# Patient Record
Sex: Male | Born: 1980 | Hispanic: No | Marital: Married | Smoking: Never smoker
Health system: Southern US, Community
[De-identification: ages and names within clinical notes are randomized; demographics above are authoritative.]

---

## 2018-01-02 ENCOUNTER — Emergency Department
Admission: EM | Admit: 2018-01-02 | Discharge: 2018-01-02 | Disposition: A | Discharge status: Home / Self Care | Payer: BLUE CROSS/BLUE SHIELD | Attending: Emergency Medicine | Admitting: Emergency Medicine

## 2018-01-02 ENCOUNTER — Other Ambulatory Visit: Payer: Self-pay

## 2018-01-02 ENCOUNTER — Encounter: Payer: Self-pay | Admitting: Emergency Medicine

## 2018-01-02 ENCOUNTER — Emergency Department: Payer: BLUE CROSS/BLUE SHIELD

## 2018-01-02 DIAGNOSIS — Y9366 Activity, soccer: Secondary | ICD-10-CM | POA: Insufficient documentation

## 2018-01-02 DIAGNOSIS — Y998 Other external cause status: Secondary | ICD-10-CM | POA: Diagnosis not present

## 2018-01-02 DIAGNOSIS — S060X0A Concussion without loss of consciousness, initial encounter: Secondary | ICD-10-CM | POA: Insufficient documentation

## 2018-01-02 DIAGNOSIS — R51 Headache: Secondary | ICD-10-CM | POA: Diagnosis not present

## 2018-01-02 DIAGNOSIS — Y92322 Soccer field as the place of occurrence of the external cause: Secondary | ICD-10-CM | POA: Diagnosis not present

## 2018-01-02 DIAGNOSIS — S0003XA Contusion of scalp, initial encounter: Secondary | ICD-10-CM | POA: Insufficient documentation

## 2018-01-02 DIAGNOSIS — R110 Nausea: Secondary | ICD-10-CM | POA: Insufficient documentation

## 2018-01-02 DIAGNOSIS — W01198A Fall on same level from slipping, tripping and stumbling with subsequent striking against other object, initial encounter: Secondary | ICD-10-CM | POA: Diagnosis not present

## 2018-01-02 DIAGNOSIS — R42 Dizziness and giddiness: Secondary | ICD-10-CM | POA: Insufficient documentation

## 2018-01-02 DIAGNOSIS — S0990XA Unspecified injury of head, initial encounter: Secondary | ICD-10-CM | POA: Diagnosis present

## 2018-01-02 NOTE — ED Provider Notes (Signed)
[facility] Emergency Department Provider Note   ____________________________________________   First MD Initiated Contact with Patient 01/02/18 1238     (approximate)  I have reviewed the triage vital signs and the nursing notes.   HISTORY  Chief Complaint No chief complaint on file.    HPI Shane Armstrong is a 37 y.o. male patient complaining of throbbing occipital headache, increased nausea and vertigo.  Denies vision disturbance.  Patient state he was playing soccer yesterday when he was thrown into a [place] and fell striking the back of his head.  Patient state he does not remember being taken outside after the incident.  Patient stated waking this morning with increased nausea but denies vomiting.  Patient contacted PCP was told to come to the emergency room for further evaluation.  Patient states the left occipital area of his head is tender to palpation but cannot feel a "knot".  Patient rates his pain as a 6/10.  Patient describes his pain is "throbbing".  No palliative measure for complaint.  History reviewed. No pertinent past medical history.  There are no active problems to display for this patient.   History reviewed. No pertinent surgical history.  Prior to Admission medications   Not on File    Allergies Patient has no known allergies.  No family history on file.  Social History Social History   Tobacco Use  . Smoking status: Never Smoker  . Smokeless tobacco: Never Used  Substance Use Topics  . Alcohol use: Not Currently  . Drug use: Never    Review of Systems Constitutional: No fever/chills Eyes: No visual changes. ENT: No sore throat. Cardiovascular: Denies chest pain. Respiratory: Denies shortness of breath. Gastrointestinal: No abdominal pain.  Nausea without vomiting.  No diarrhea.  No constipation. Genitourinary: Negative for dysuria. Musculoskeletal: Negative for back pain. Skin: Negative for  rash. Neurological: Positive for occipital headache headaches, but denies focal weakness or numbness.  States mild transient vertigo for abrupt movements.   ____________________________________________   PHYSICAL EXAM:  VITAL SIGNS: ED Triage Vitals  Enc Vitals Group     BP 01/02/18 1233 115/70     Pulse Rate 01/02/18 1233 64     Resp 01/02/18 1233 16     Temp 01/02/18 1233 97.9 F (36.6 C)     Temp Source 01/02/18 1233 Oral     SpO2 01/02/18 1233 100 %     Weight 01/02/18 1234 170 lb (77.1 kg)     Height 01/02/18 1234 5\' 11"  (1.803 m)     Head Circumference --      Peak Flow --      Pain Score 01/02/18 1233 6     Pain Loc --      Pain Edu? --      Excl. in GC? --    Constitutional: Alert and oriented. Well appearing and in no acute distress. Eyes: Conjunctivae are normal. PERRL. EOMI. Head: Atraumatic.  Moderate guarding palpation left occipital area. Nose: No congestion/rhinnorhea. Mouth/Throat: Mucous membranes are moist.  Oropharynx non-erythematous. Neck: No stridor.  No cervical spine tenderness to palpation. Cardiovascular: Normal rate, regular rhythm. Grossly normal heart sounds.  Good peripheral circulation. Respiratory: Normal respiratory effort.  No retractions. Lungs CTAB. Gastrointestinal: Soft and nontender. No distention. No abdominal bruits. No CVA tenderness. Neurologic:  Normal speech and language. No gross focal neurologic deficits are appreciated. No gait instability. Skin:  Skin is warm, dry and intact. No rash noted. Psychiatric: Mood and affect are normal.  Speech and behavior are normal.  ____________________________________________   LABS (all labs ordered are listed, but only abnormal results are displayed)  Labs Reviewed - No data to display ____________________________________________  EKG   ____________________________________________  RADIOLOGY  ED MD interpretation:    Official radiology report(s): Ct Head Wo Contrast  Result  Date: 01/02/2018 CLINICAL DATA:  Occipital head injury.  Injured playing soccer. EXAM: CT HEAD WITHOUT CONTRAST TECHNIQUE: Contiguous axial images were obtained from the base of the skull through the vertex without intravenous contrast. COMPARISON:  None FINDINGS: Brain: No evidence of acute infarction, hemorrhage, hydrocephalus, extra-axial collection or mass lesion/mass effect. Vascular: No hyperdense vessel or unexpected calcification. Skull: No osseous abnormality. Sinuses/Orbits: Visualized paranasal sinuses are clear. Visualized mastoid sinuses are clear. Visualized orbits demonstrate no focal abnormality. Other: None IMPRESSION: No acute intracranial pathology. Electronically Signed   By: Elige KoHetal  Patel   On: 01/02/2018 13:13    ____________________________________________   PROCEDURES  Procedure(s) performed: None  Procedures  Critical Care performed: No  ____________________________________________   INITIAL IMPRESSION / ASSESSMENT AND PLAN / ED COURSE  As part of my medical decision making, I reviewed the following data within the electronic MEDICAL RECORD NUMBER    Headache secondary to concussion.  Discussed CT findings with patient.  Discussed sequela of concussion with patient.  Patient given discharge care instruction.  Patient advised over-the-counter extra Tylenol as needed for headache and pain.  Patient advised [facility] no improvement in 1 week.  Return to ED if his condition worsens.      ____________________________________________   FINAL CLINICAL IMPRESSION(S) / ED DIAGNOSES  Final diagnoses:  Concussion without loss of consciousness, initial encounter  Contusion of scalp, initial encounter     ED Discharge Orders    None       Note:  This document was prepared using Dragon voice recognition software and may include unintentional dictation errors.    Joni ReiningSmith, Ronald K, PA-C 01/02/18 1342    Minna AntisPaduchowski, Kevin,  MD 01/02/18 [phone]

## 2018-01-02 NOTE — ED Triage Notes (Signed)
PT to ED via POV with c/o possible concussion after head injury playing soccer yesterday. Pt states head is sensitive, increased nausea. Per pcp pt was told to be evaluated in ED. VSS, pt ambulatory. Speech clear.

## 2018-01-02 NOTE — Discharge Instructions (Signed)
Follow discharge care instructions.  Advised only extra strength Tylenol for headache/pain.

## 2019-02-20 IMAGING — CT CT HEAD W/O CM
3 series · 16 of 47 positions shown, 19 images · non-contrast
Comparison: None

CLINICAL DATA: Occipital head injury.  Injured playing soccer.

EXAM:
CT HEAD WITHOUT CONTRAST
TECHNIQUE: Contiguous axial images were obtained from the base of the skull
through the vertex without intravenous contrast.

[Series 2: head wo · axial · 0.42mm/px · z∈[-119,+6]mm · 10 of 30 slices shown, 13 images]
[im 3/30  brain]
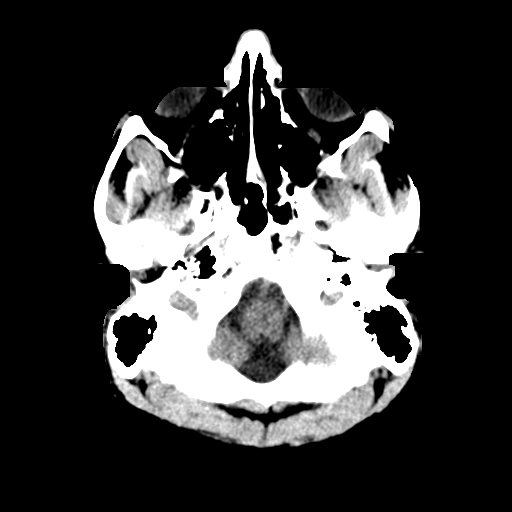
[im 3/30  bone]
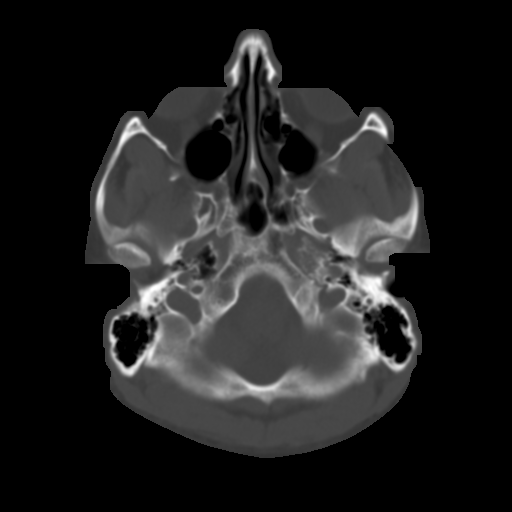
[im 6/30  brain]
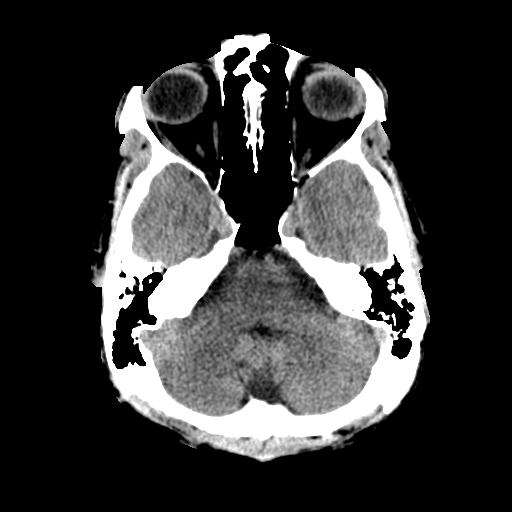
[im 9/30  brain]
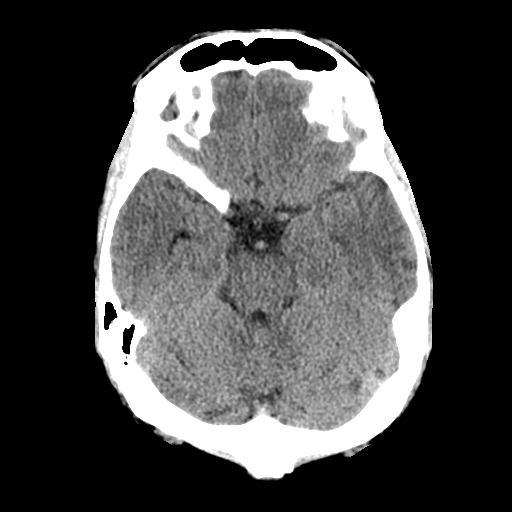
[im 11/30  brain]
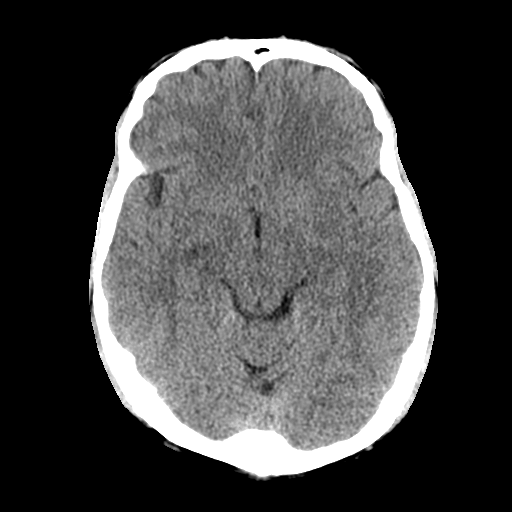
[im 14/30  brain]
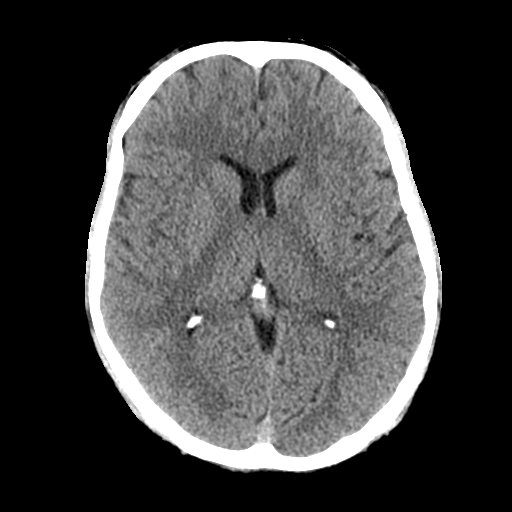
[im 14/30  bone]
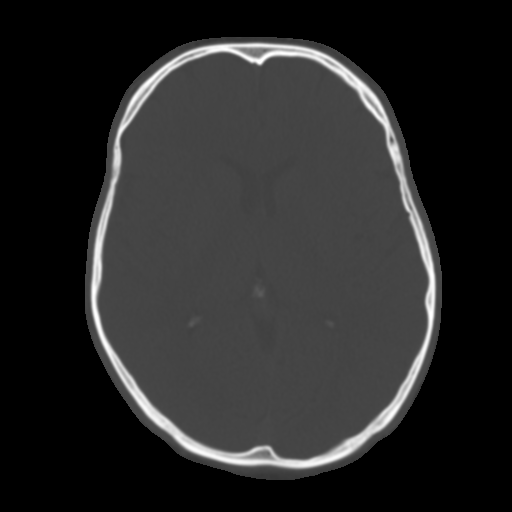
[im 17/30  brain]
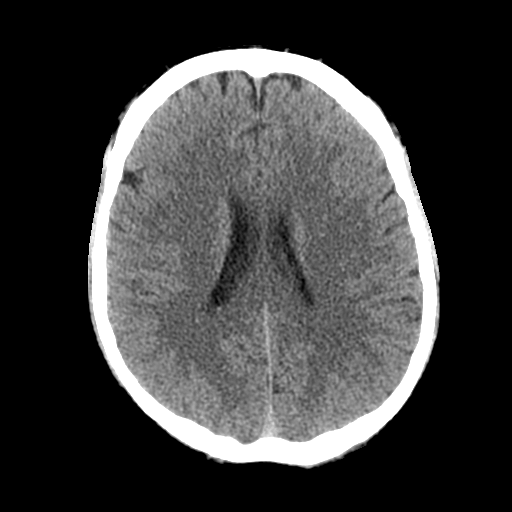
[im 20/30  brain]
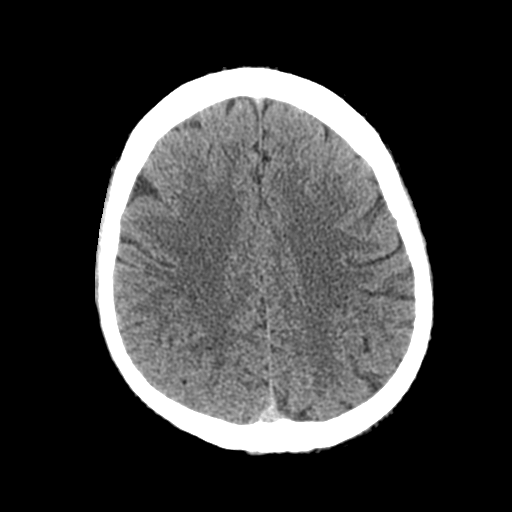
[im 23/30  brain]
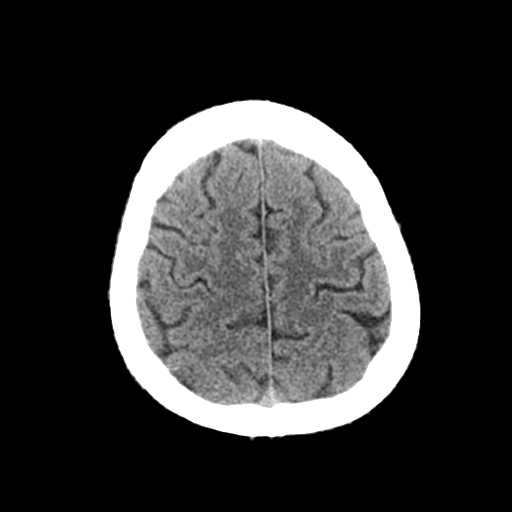
[im 25/30  brain]
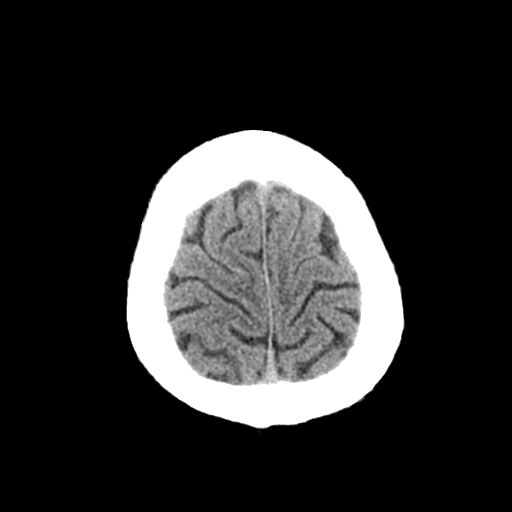
[im 25/30  bone]
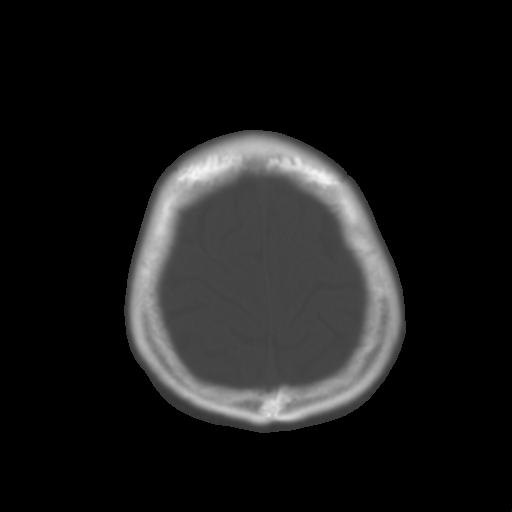
[im 28/30  brain]
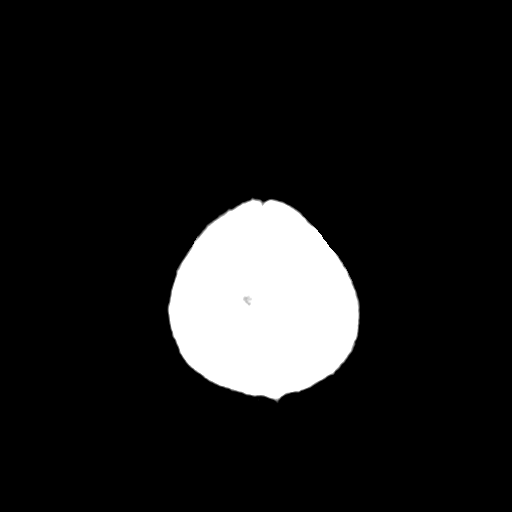

[Series 4: coronal soft tissue · coronal · 0.33mm/px · 3 of 66 slices shown]
[im 22/66  brain]
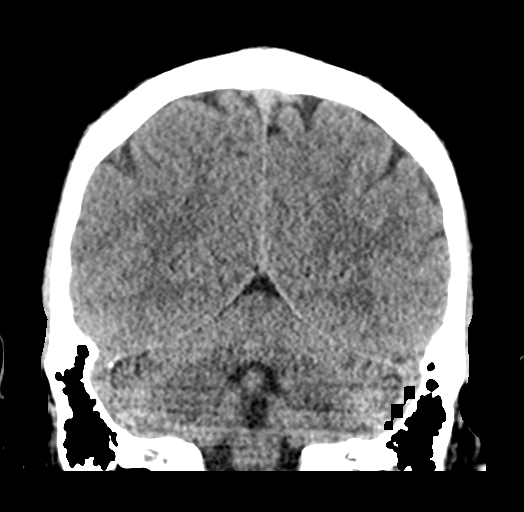
[im 29/66  brain]
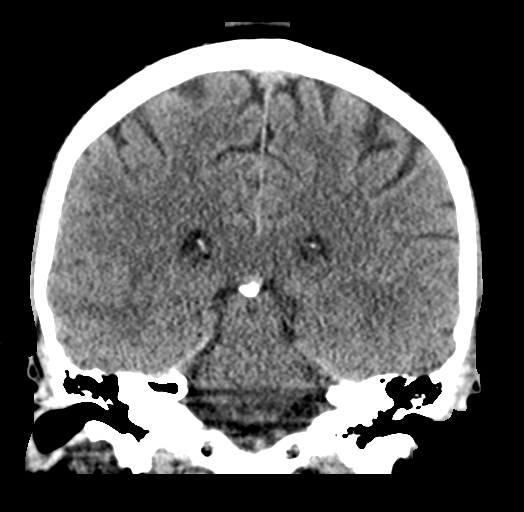
[im 37/66  brain]
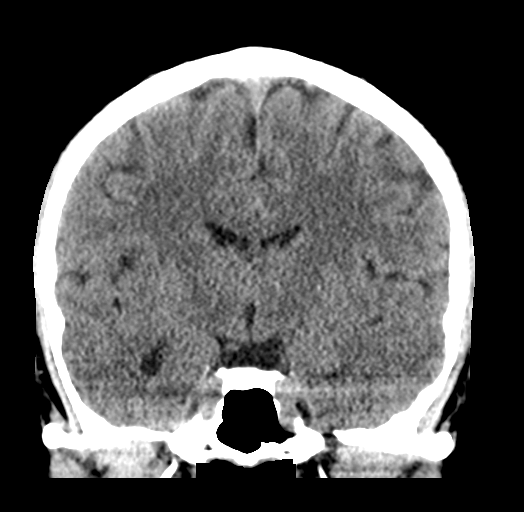

[Series 5: sagittal soft tissue · sagittal · 0.32mm/px · 3 of 55 slices shown]
[im 19/55  brain]
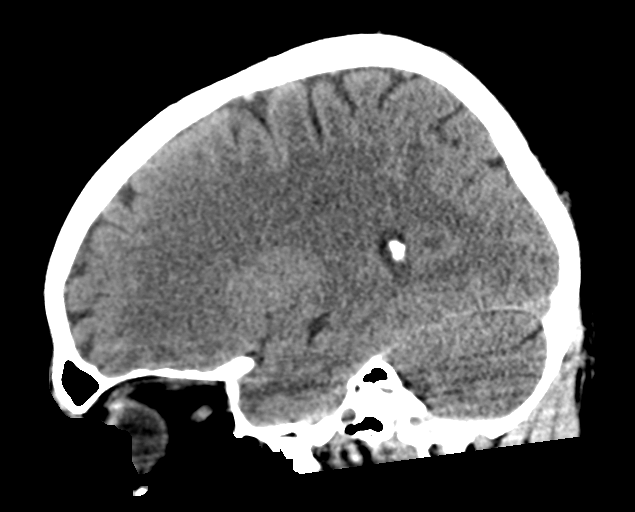
[im 28/55  brain]
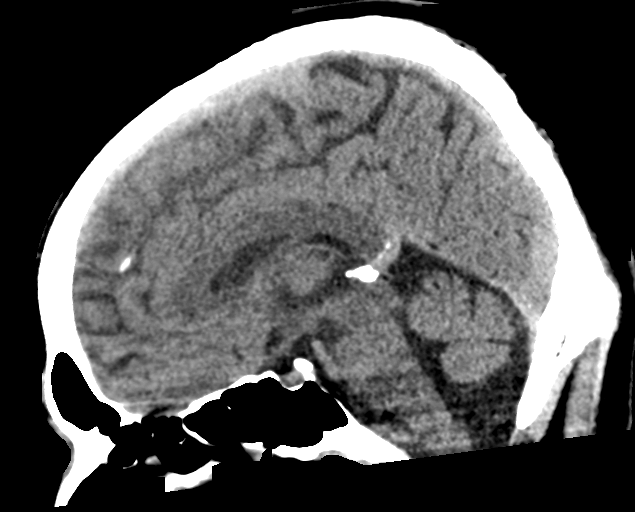
[im 37/55  brain]
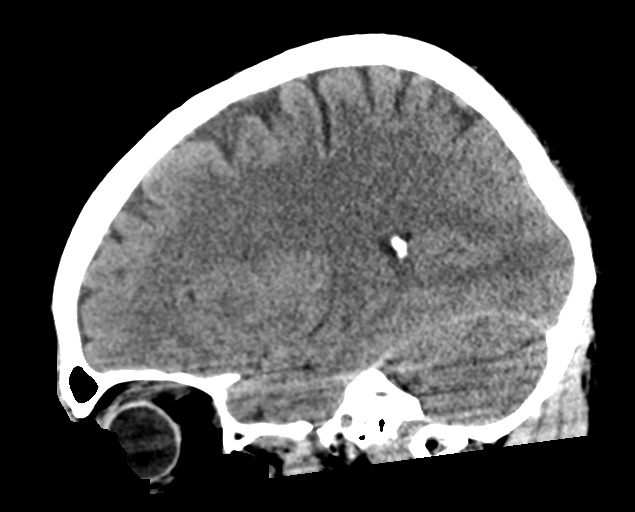

[16 of 47 positions shown; findings below may reference images not displayed]

FINDINGS: Brain: No evidence of acute infarction, hemorrhage, hydrocephalus,
extra-axial collection or mass lesion/mass effect.

Vascular: No hyperdense vessel or unexpected calcification.

Skull: No osseous abnormality.

Sinuses/Orbits: Visualized paranasal sinuses are clear. Visualized
mastoid sinuses are clear. Visualized orbits demonstrate no focal
abnormality.

Other: None
IMPRESSION: No acute intracranial pathology.
